# Patient Record
Sex: Male | Born: 1960 | Race: White | Hispanic: No | Marital: Married | Smoking: Never smoker
Health system: Southern US, Community
[De-identification: ages and names within clinical notes are randomized; demographics above are authoritative.]

## PROBLEM LIST (undated history)

## (undated) DIAGNOSIS — M1990 Unspecified osteoarthritis, unspecified site: Secondary | ICD-10-CM

## (undated) DIAGNOSIS — J45909 Unspecified asthma, uncomplicated: Secondary | ICD-10-CM

## (undated) DIAGNOSIS — I10 Essential (primary) hypertension: Secondary | ICD-10-CM

## (undated) HISTORY — PX: AMPUTATION AT METACARPAL: SUR22

## (undated) HISTORY — PX: KNEE ARTHROPLASTY: SHX992

---

## 2020-08-26 NOTE — H&P (Signed)
KNEE ARTHROPLASTY ADMISSION H&P  Patient ID: Robert Bernard MRN: [id number] DOB/AGE: 1960/10/05 59 y.o.  Chief Complaint: right knee pain.  Planned Procedure Date: 09/15/20 Medical Clearance by Clelia Croft     HPI: Robert Bernard is a 59 y.o. 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 The patient has a history of pain and functional disability in the right knee due to arthritis and has failed non-surgical conservative treatments for greater than 12 weeks to include NSAID's and/or analgesics, corticosteriod injections and activity modification.  Onset of symptoms was gradual, starting 3 years ago with gradually worsening course since that time. The patient noted no past surgeries on the right knee.  Patient currently rates pain at 7 out of 10 with activity. Patient has night pain, worsening of pain with activity and weight bearing, pain that interferes with activities of daily living, pain with passive range of motion and joint swelling.  Patient has evidence of periarticular osteophytes and joint space narrowing by imaging studies.  There is no active infection.  Past medical history: Asthma HTN  Past Medications  HCTZ 12.5mg  3x per week Metoprol ER 100mg  BID Meloxicam 15mg  qAM Acetaminophen 650mg  BID Vit A qAM Melatonin 3mg  qPM Vit D3 5000U qPM Vit C 1000mg  qPM Magnesium 400mg  qPM Ibuprofen PM 200mg /38mg  2 tabs qPM    Allergies: Codeine: Hallucinations   Social History   Socioeconomic History  . Marital status: Not on file    Spouse name: Not on file  . Number of children: Not on file  . Years of education: Not on file  . Highest education level: Not on file  Occupational History  . Not on file  Tobacco Use  . Smoking status: Not on file  Substance and Sexual Activity  . Alcohol use: Not on file  . Drug use: Not on file  . Sexual activity: Not on file  Other Topics Concern  . Not on file  Social History Narrative  . Not on file   Social Determinants of  Health   Financial Resource Strain:   . Difficulty of Paying Living Expenses: Not on file  Food Insecurity:   . Worried About in the Last Year: Not on file  . Ran Out of Food in the Last Year: Not on file  Transportation Needs:   . Lack of Transportation (Medical): Not on file  . Lack of Transportation (Non-Medical): Not on file  Physical Activity:   . Days of Exercise per Week: Not on file  . Minutes of Exercise per Session: Not on file  Stress:   . Feeling of Stress : Not on file  Social Connections:   . Frequency of Communication with Friends and Family: Not on file  . Frequency of Social Gatherings with Friends and Family: Not on file  . Attends Religious Services: Not on file  . Active Member of Clubs or Organizations: Not on file  . Attends Meetings: Not on file  . Marital Status: Not on file   Family History Father: Heart Disease, MI, HTN, DM II Mother: HTN, DMII, Uterine CA   ROS: Currently denies lightheadedness, dizziness, Fever, chills, CP, SOB. No personal history of DVT, PE, MI, or CVA. No loose teeth or dentures All other systems have been reviewed and were otherwise currently negative with the exception of those mentioned in the HPI and as above.  Objective: Vitals: Ht: 60 Wt: 215 lbs Temp: 97.5 BP: 137/88 Pulse: 56 O2 96% on  room air.   Physical Exam: General: Alert, NAD.  HEENT: EOMI, Trachea Midline, Head is AT/[place] Pulm: No increased work of breathing.  Clear B/L A/P w/o crackle or wheeze.  CV: RRR, No m/g/r appreciated  GI: soft, NT, ND Neuro: Neuro without gross focal deficit.  Sensation intact distally Skin: No lesions in the area of chief complaint MSK/Surgical Site: Right knee w/o redness or effusion.  Positive JLT. ROM limited 2/2 pain.  5/5 strength in extension and flexion.  +EHL/FHL.  NVI.  Stable varus and valgus stress.    Imaging Review Plain radiographs demonstrate severe degenerative joint disease  of the right knee.   The overall alignment isneutral. The bone quality appears to be good for age and reported activity level.  Preoperative templating of the joint replacement has been completed, documented, and submitted to the Operating Room personnel in order to optimize intra-operative equipment management.  Assessment: OA RIGHT KNEE Active Problems:   * No active hospital problems. *   Plan: Plan for Procedure(s): TOTAL KNEE ARTHROPLASTY  The patient history, physical exam, clinical judgement of the provider and imaging are consistent with end stage degenerative joint disease and right joint arthroplasty is deemed medically necessary. The treatment options including medical management, injection therapy, and arthroplasty were discussed at length. The risks and benefits of Procedure(s): TOTAL KNEE ARTHROPLASTY were presented and reviewed.  The risks of nonoperative treatment, versus surgical intervention including but not limited to continued pain, aseptic loosening, stiffness, dislocation/subluxation, infection, bleeding, nerve injury, blood clots, cardiopulmonary complications, morbidity, mortality, among others were discussed. The patient verbalizes understanding and wishes to proceed with the plan.  Patient is being admitted for inpatient treatment for surgery, pain control, PT, prophylactic antibiotics, VTE prophylaxis, progressive ambulation, ADL's and discharge planning.   Dental prophylaxis discussed and recommended for 2 years postoperatively.   The patient does meet the criteria for TXA which will be used perioperatively.    ASA 81 mg BID will be used postoperatively for DVT prophylaxis in addition to SCDs, and early ambulation.  Plan for Tylenol, Celebrex, oxycodone for pain.  Robaxin for spasm.  Omeprazole for gastric protection.  The patient is planning to be discharged home with OPPT in care of his wife.   Rainey Pines, PA-C 08/26/2020 1:08 PM

## 2020-09-04 NOTE — Patient Instructions (Addendum)
DUE TO COVID-19 ONLY ONE VISITOR IS ALLOWED TO COME WITH YOU AND STAY IN THE WAITING ROOM ONLY DURING PRE OP AND PROCEDURE DAY OF SURGERY. THE 1 VISITOR  MAY VISIT WITH YOU AFTER SURGERY IN YOUR PRIVATE ROOM DURING VISITING HOURS ONLY!  YOU NEED TO HAVE A COVID 19 TEST ON: 09/12/20 @ 11:00 AM, THIS TEST MUST BE DONE BEFORE SURGERY,  COVID TESTING SITE [address] [place] [address], IT IS ON THE RIGHT GOING OUT [address] APPROXIMATELY  2 MINUTES PAST [employer] ON THE RIGHT. ONCE YOUR COVID TEST IS COMPLETED,  PLEASE BEGIN THE QUARANTINE INSTRUCTIONS AS OUTLINED IN YOUR HANDOUT.                Robert Bernard     Your procedure is scheduled on: 09/15/20   Report to [facility] Main  Entrance   Report to short stay at: 5:30 AM     Call this number if you have problems the morning of surgery [phone]    Remember:   NO SOLID FOOD AFTER MIDNIGHT THE NIGHT PRIOR TO SURGERY. NOTHING BY MOUTH EXCEPT CLEAR LIQUIDS UNTIL: 4:30 AM . PLEASE FINISH ENSURE DRINK PER SURGEON ORDER  WHICH NEEDS TO BE COMPLETED AT: 4:30 AM .  CLEAR LIQUID DIET   Foods Allowed                                                                     Foods Excluded  Coffee and tea, regular and decaf                             liquids that you cannot  Plain Jell-O any favor except red or purple                                           see through such as: Fruit ices (not with fruit pulp)                                     milk, soups, orange juice  Iced Popsicles                                    All solid food Carbonated beverages, regular and diet                                    Cranberry, grape and apple juices Sports drinks like Gatorade Lightly seasoned clear broth or consume(fat free) Sugar, honey syrup  Sample Menu Breakfast                                Lunch  Supper Cranberry juice                    Beef broth                             Chicken broth Jell-O                                     Grape juice                           Apple juice Coffee or tea                        Jell-O                                      Popsicle                                                Coffee or tea                        Coffee or tea  _____________________________________________________________________   BRUSH YOUR TEETH MORNING OF SURGERY AND RINSE YOUR MOUTH OUT, NO CHEWING GUM CANDY OR MINTS.   Take Metoprolol with a sip of water.                              You may not have any metal on your body including hair pins and              piercings  Do not wear jewelry, lotions, powders or perfumes, deodorant             Men may shave face and neck.   Do not bring valuables to the hospital. [facility] IS NOT             RESPONSIBLE   FOR VALUABLES.  Contacts, dentures or bridgework may not be worn into surgery.  Leave suitcase in the car. After surgery it may be brought to your room.     Patients discharged the day of surgery will not be allowed to drive home. IF YOU ARE HAVING SURGERY AND GOING HOME THE SAME DAY, YOU MUST HAVE AN ADULT TO DRIVE YOU HOME AND BE WITH YOU FOR 24 HOURS. YOU MAY GO HOME BY TAXI OR UBER OR ORTHERWISE, BUT AN ADULT MUST ACCOMPANY YOU HOME AND STAY WITH YOU FOR 24 HOURS.  Name and phone number of your driver:  Special Instructions: N/A              Please read over the following fact sheets you were given: _____________________________________________________________________        [facility] - Preparing for Surgery Before surgery, you can play an important role.  Because skin is not sterile, your skin needs to be as free of germs as possible.  You can reduce the number of germs on your skin by washing with CHG (chlorahexidine gluconate) soap before surgery.  CHG is an antiseptic cleaner  which kills germs and bonds with the skin to continue killing germs even after washing. Please DO NOT use  if you have an allergy to CHG or antibacterial soaps.  If your skin becomes reddened/irritated stop using the CHG and inform your nurse when you arrive at Short Stay. Do not shave (including legs and underarms) for at least 48 hours prior to the first CHG shower.  You may shave your face/neck. Please follow these instructions carefully:  1.  Shower with CHG Soap the night before surgery and the  morning of Surgery.  2.  If you choose to wash your hair, wash your hair first as usual with your  normal  shampoo.  3.  After you shampoo, rinse your hair and body thoroughly to remove the  shampoo.                           4.  Use CHG as you would any other liquid soap.  You can apply chg directly  to the skin and wash                       Gently with a scrungie or clean washcloth.  5.  Apply the CHG Soap to your body ONLY FROM THE NECK DOWN.   Do not use on face/ open                           Wound or open sores. Avoid contact with eyes, ears mouth and genitals (private parts).                       Wash face,  Genitals (private parts) with your normal soap.             6.  Wash thoroughly, paying special attention to the area where your surgery  will be performed.  7.  Thoroughly rinse your body with warm water from the neck down.  8.  DO NOT shower/wash with your normal soap after using and rinsing off  the CHG Soap.                9.  Pat yourself dry with a clean towel.            10.  Wear clean pajamas.            11.  Place clean sheets on your bed the night of your first shower and do not  sleep with pets. Day of Surgery : Do not apply any lotions/deodorants the morning of surgery.  Please wear clean clothes to the hospital/surgery center.  FAILURE TO FOLLOW THESE INSTRUCTIONS MAY RESULT IN THE CANCELLATION OF YOUR SURGERY PATIENT SIGNATURE_________________________________  NURSE  SIGNATURE__________________________________  ________________________________________________________________________

## 2020-09-07 ENCOUNTER — Encounter (HOSPITAL_COMMUNITY): Payer: Self-pay

## 2020-09-07 ENCOUNTER — Encounter (INDEPENDENT_AMBULATORY_CARE_PROVIDER_SITE_OTHER): Payer: Self-pay

## 2020-09-07 ENCOUNTER — Other Ambulatory Visit: Payer: Self-pay

## 2020-09-07 ENCOUNTER — Encounter (HOSPITAL_COMMUNITY)
Admission: RE | Admit: 2020-09-07 | Discharge: 2020-09-07 | Disposition: A | Discharge status: Home / Self Care | Payer: 59 | Source: Ambulatory Visit | Attending: Orthopedic Surgery | Admitting: Orthopedic Surgery

## 2020-09-07 DIAGNOSIS — Z01818 Encounter for other preprocedural examination: Secondary | ICD-10-CM | POA: Insufficient documentation

## 2020-09-07 HISTORY — DX: Essential (primary) hypertension: I10

## 2020-09-07 HISTORY — DX: Unspecified asthma, uncomplicated: J45.909

## 2020-09-07 HISTORY — DX: Unspecified osteoarthritis, unspecified site: M19.90

## 2020-09-07 LAB — BASIC METABOLIC PANEL
Anion gap: 8 (ref 5–15)
BUN: 24 mg/dL — ABNORMAL HIGH (ref 6–20)
CO2: 25 mmol/L (ref 22–32)
Calcium: 9.3 mg/dL (ref 8.9–10.3)
Chloride: 106 mmol/L (ref 98–111)
Creatinine, Ser: 0.71 mg/dL (ref 0.61–1.24)
GFR, Estimated: >60 mL/min (ref >60)
Glucose, Bld: 114 mg/dL — ABNORMAL HIGH (ref 70–99)
Potassium: 4.6 mmol/L (ref 3.5–5.1)
Sodium: 139 mmol/L (ref 135–145)

## 2020-09-07 LAB — CBC
HCT: 42.3 % (ref 39.0–52.0)
Hemoglobin: 14.1 g/dL (ref 13.0–17.0)
MCH: 32 pg (ref 26.0–34.0)
MCHC: 33.3 g/dL (ref 30.0–36.0)
MCV: 96.1 fL (ref 80.0–100.0)
Platelets: 172 10*3/uL (ref 150–400)
RBC: 4.4 MIL/uL (ref 4.22–5.81)
RDW: 12.9 % (ref 11.5–15.5)
WBC: 5.8 10*3/uL (ref 4.0–10.5)
nRBC: 0 % (ref 0.0–0.2)

## 2020-09-07 LAB — SURGICAL PCR SCREEN
MRSA, PCR: NEGATIVE
Staphylococcus aureus: NEGATIVE

## 2020-09-07 NOTE — Progress Notes (Signed)
COVID Vaccine Completed: NO Date COVID Vaccine completed: COVID vaccine manufacturer: Pfizer    Moderna   [employer]   PCP - Lucas Mallow: PA Cardiologist -   Chest x-ray -  EKG - Requested Stress Test -  ECHO -  Cardiac Cath -  Pacemaker/ICD device last checked:  Sleep Study -  CPAP -   Fasting Blood Sugar -  Checks Blood Sugar _____ times a day  Blood Thinner Instructions: Aspirin Instructions: Last Dose:  Anesthesia review: Hx: HTN  Patient denies shortness of breath, fever, cough and chest pain at PAT appointment   Patient verbalized understanding of instructions that were given to them at the PAT appointment. Patient was also instructed that they will need to review over the PAT instructions again at home before surgery.

## 2020-09-12 ENCOUNTER — Other Ambulatory Visit (HOSPITAL_COMMUNITY)
Admission: RE | Admit: 2020-09-12 | Discharge: 2020-09-12 | Disposition: A | Discharge status: Home / Self Care | Payer: 59 | Source: Ambulatory Visit | Attending: Orthopedic Surgery | Admitting: Orthopedic Surgery

## 2020-09-12 DIAGNOSIS — Z01812 Encounter for preprocedural laboratory examination: Secondary | ICD-10-CM | POA: Diagnosis present

## 2020-09-12 DIAGNOSIS — Z20822 Contact with and (suspected) exposure to covid-19: Secondary | ICD-10-CM | POA: Insufficient documentation

## 2020-09-12 LAB — SARS CORONAVIRUS 2 (TAT 6-24 HRS): SARS Coronavirus 2: NEGATIVE

## 2020-09-14 NOTE — Anesthesia Preprocedure Evaluation (Addendum)
Anesthesia Evaluation  Patient identified by MRN, date of birth, ID band Patient awake    Reviewed: Allergy & Precautions, NPO status , Patient's Chart, lab work & pertinent test results  Airway Mallampati: I       Dental no notable dental hx.    Pulmonary asthma ,    Pulmonary exam normal        Cardiovascular hypertension, Pt. on home beta blockers and Pt. on medications Normal cardiovascular exam     Neuro/Psych negative neurological ROS  negative psych ROS   GI/Hepatic negative GI ROS, Neg liver ROS,   Endo/Other  negative endocrine ROS  Renal/GU negative Renal ROS  negative genitourinary   Musculoskeletal   Abdominal Normal abdominal exam  (+) - obese,   Peds  Hematology negative hematology ROS (+)   Anesthesia Other Findings   Reproductive/Obstetrics                            Anesthesia Physical Anesthesia Plan  ASA: II  Anesthesia Plan: Spinal   Post-op Pain Management:  [facility]ional for Post-op pain   Induction:   PONV Risk Score and Plan: 1 and Ondansetron, Dexamethasone and Propofol infusion  Airway Management Planned: Natural Airway and Simple Face Mask  Additional Equipment: None  Intra-op Plan:   Post-operative Plan:   Informed Consent: I have reviewed the patients History and Physical, chart, labs and discussed the procedure including the risks, benefits and alternatives for the proposed anesthesia with the patient or authorized representative who has indicated his/her understanding and acceptance.       Plan Discussed with: CRNA  Anesthesia Plan Comments:        Anesthesia Quick Evaluation

## 2020-09-15 ENCOUNTER — Ambulatory Visit (HOSPITAL_COMMUNITY): Payer: 59

## 2020-09-15 ENCOUNTER — Ambulatory Visit (HOSPITAL_COMMUNITY): Payer: 59 | Admitting: Certified Registered"

## 2020-09-15 ENCOUNTER — Encounter (HOSPITAL_COMMUNITY)
Admission: RE | Disposition: A | Discharge status: Home / Self Care | Payer: Self-pay | Source: Home / Self Care | Attending: Orthopedic Surgery

## 2020-09-15 ENCOUNTER — Ambulatory Visit (HOSPITAL_COMMUNITY)
Admission: RE | Admit: 2020-09-15 | Discharge: 2020-09-15 | Disposition: A | Discharge status: Home / Self Care | Payer: 59 | Attending: Orthopedic Surgery | Admitting: Orthopedic Surgery

## 2020-09-15 ENCOUNTER — Other Ambulatory Visit: Payer: Self-pay

## 2020-09-15 ENCOUNTER — Encounter (HOSPITAL_COMMUNITY): Payer: Self-pay | Admitting: Orthopedic Surgery

## 2020-09-15 DIAGNOSIS — Z96651 Presence of right artificial knee joint: Secondary | ICD-10-CM

## 2020-09-15 DIAGNOSIS — M25761 Osteophyte, right knee: Secondary | ICD-10-CM | POA: Diagnosis not present

## 2020-09-15 DIAGNOSIS — M1711 Unilateral primary osteoarthritis, right knee: Secondary | ICD-10-CM | POA: Insufficient documentation

## 2020-09-15 DIAGNOSIS — M25461 Effusion, right knee: Secondary | ICD-10-CM | POA: Diagnosis not present

## 2020-09-15 DIAGNOSIS — Z885 Allergy status to narcotic agent status: Secondary | ICD-10-CM | POA: Insufficient documentation

## 2020-09-15 HISTORY — PX: TOTAL KNEE ARTHROPLASTY: SHX125

## 2020-09-15 SURGERY — ARTHROPLASTY, KNEE, TOTAL
Anesthesia: Spinal | Site: Knee | Laterality: Right

## 2020-09-15 MED ORDER — STERILE WATER FOR IRRIGATION IR SOLN
Status: DC | PRN
  Administered 2020-09-15: 2000 mL

## 2020-09-15 MED ORDER — OXYCODONE HCL 5 MG PO TABS: 5.0000 mg | ORAL_TABLET | Freq: Four times a day (QID) | ORAL | 0 refills | Status: AC | PRN

## 2020-09-15 MED ORDER — DEXAMETHASONE SODIUM PHOSPHATE 10 MG/ML IJ SOLN
INTRAMUSCULAR | Status: DC | PRN
  Administered 2020-09-15: 8 mg via INTRAVENOUS

## 2020-09-15 MED ORDER — PROPOFOL 500 MG/50ML IV EMUL
INTRAVENOUS | Status: AC
  Filled 2020-09-15: qty 50

## 2020-09-15 MED ORDER — ORAL CARE MOUTH RINSE: 15.0000 mL | Freq: Once | OROMUCOSAL | Status: AC

## 2020-09-15 MED ORDER — PROPOFOL 500 MG/50ML IV EMUL
INTRAVENOUS | Status: DC | PRN
  Administered 2020-09-15: 75 ug/kg/min via INTRAVENOUS

## 2020-09-15 MED ORDER — ACETAMINOPHEN 160 MG/5ML PO SOLN: 325.0000 mg | ORAL | Status: DC | PRN

## 2020-09-15 MED ORDER — MEPIVACAINE HCL (PF) 2 % IJ SOLN
INTRAMUSCULAR | Status: DC | PRN
  Administered 2020-09-15: 3.5 mL via EPIDURAL

## 2020-09-15 MED ORDER — TRANEXAMIC ACID-NACL 1000-0.7 MG/100ML-% IV SOLN: 1000.0000 mg | INTRAVENOUS | Status: DC

## 2020-09-15 MED ORDER — MEPERIDINE HCL 50 MG/ML IJ SOLN: 6.2500 mg | INTRAMUSCULAR | Status: DC | PRN

## 2020-09-15 MED ORDER — PROPOFOL 10 MG/ML IV BOLUS
INTRAVENOUS | Status: AC
  Filled 2020-09-15: qty 40

## 2020-09-15 MED ORDER — LIDOCAINE 2% (20 MG/ML) 5 ML SYRINGE
INTRAMUSCULAR | Status: DC | PRN
  Administered 2020-09-15: 40 mg via INTRAVENOUS

## 2020-09-15 MED ORDER — ONDANSETRON HCL 4 MG/2ML IJ SOLN
INTRAMUSCULAR | Status: AC
  Filled 2020-09-15: qty 2

## 2020-09-15 MED ORDER — ACETAMINOPHEN 325 MG PO TABS: 325.0000 mg | ORAL_TABLET | Freq: Four times a day (QID) | ORAL | Status: DC | PRN

## 2020-09-15 MED ORDER — 0.9 % SODIUM CHLORIDE (POUR BTL) OPTIME
TOPICAL | Status: DC | PRN
  Administered 2020-09-15: 08:00:00 1000 mL

## 2020-09-15 MED ORDER — HYDROMORPHONE HCL 1 MG/ML IJ SOLN: 0.5000 mg | INTRAMUSCULAR | Status: DC | PRN

## 2020-09-15 MED ORDER — ONDANSETRON HCL 4 MG PO TABS: 4.0000 mg | ORAL_TABLET | Freq: Four times a day (QID) | ORAL | Status: DC | PRN

## 2020-09-15 MED ORDER — LACTATED RINGERS IV SOLN: INTRAVENOUS | Status: DC

## 2020-09-15 MED ORDER — ACETAMINOPHEN 500 MG PO TABS: 1000.0000 mg | ORAL_TABLET | Freq: Four times a day (QID) | ORAL | 2 refills | Status: AC | PRN

## 2020-09-15 MED ORDER — SODIUM CHLORIDE (PF) 0.9 % IJ SOLN
INTRAMUSCULAR | Status: AC
  Filled 2020-09-15: qty 30

## 2020-09-15 MED ORDER — CEFAZOLIN SODIUM-DEXTROSE 2-4 GM/100ML-% IV SOLN: 2.0000 g | Freq: Four times a day (QID) | INTRAVENOUS | Status: DC

## 2020-09-15 MED ORDER — SODIUM CHLORIDE 0.9 % IR SOLN
Status: DC | PRN
  Administered 2020-09-15: 1000 mL

## 2020-09-15 MED ORDER — PHENOL 1.4 % MT LIQD: 1.0000 | OROMUCOSAL | Status: DC | PRN

## 2020-09-15 MED ORDER — ONDANSETRON HCL 4 MG PO TABS: 4.0000 mg | ORAL_TABLET | Freq: Three times a day (TID) | ORAL | 0 refills | Status: AC | PRN

## 2020-09-15 MED ORDER — DOCUSATE SODIUM 100 MG PO CAPS
100.0000 mg | ORAL_CAPSULE | Freq: Two times a day (BID) | ORAL | Status: DC
Start: 2020-09-15 — End: 2020-09-15

## 2020-09-15 MED ORDER — ROPIVACAINE HCL 7.5 MG/ML IJ SOLN
INTRAMUSCULAR | Status: DC | PRN
  Administered 2020-09-15 (×4): 5 mL via PERINEURAL

## 2020-09-15 MED ORDER — CLONIDINE HCL (ANALGESIA) 100 MCG/ML EP SOLN
EPIDURAL | Status: DC | PRN
  Administered 2020-09-15: 100 ug

## 2020-09-15 MED ORDER — SODIUM CHLORIDE 0.9% FLUSH
INTRAVENOUS | Status: DC | PRN
  Administered 2020-09-15: 30 mL

## 2020-09-15 MED ORDER — BISACODYL 10 MG RE SUPP
10.0000 mg | Freq: Every day | RECTAL | Status: DC | PRN
Start: 2020-09-15 — End: 2020-09-15

## 2020-09-15 MED ORDER — DIPHENHYDRAMINE HCL 12.5 MG/5ML PO ELIX: 12.5000 mg | ORAL_SOLUTION | ORAL | Status: DC | PRN

## 2020-09-15 MED ORDER — METHOCARBAMOL 500 MG IVPB - SIMPLE MED: 500.0000 mg | Freq: Four times a day (QID) | INTRAVENOUS | Status: DC | PRN

## 2020-09-15 MED ORDER — EPHEDRINE SULFATE-NACL 50-0.9 MG/10ML-% IV SOSY
PREFILLED_SYRINGE | INTRAVENOUS | Status: DC | PRN
  Administered 2020-09-15 (×3): 10 mg via INTRAVENOUS

## 2020-09-15 MED ORDER — PROPOFOL 1000 MG/100ML IV EMUL
INTRAVENOUS | Status: AC
  Filled 2020-09-15: qty 100

## 2020-09-15 MED ORDER — CELECOXIB 200 MG PO CAPS: 200.0000 mg | ORAL_CAPSULE | Freq: Two times a day (BID) | ORAL | 0 refills | Status: AC

## 2020-09-15 MED ORDER — OXYCODONE HCL 5 MG PO TABS
ORAL_TABLET | ORAL | Status: AC
  Filled 2020-09-15: qty 2

## 2020-09-15 MED ORDER — MENTHOL 3 MG MT LOZG: 1.0000 | LOZENGE | OROMUCOSAL | Status: DC | PRN

## 2020-09-15 MED ORDER — METOCLOPRAMIDE HCL 5 MG PO TABS: 5.0000 mg | ORAL_TABLET | Freq: Three times a day (TID) | ORAL | Status: DC | PRN

## 2020-09-15 MED ORDER — ROPIVACAINE HCL 5 MG/ML IJ SOLN
INTRAMUSCULAR | Status: DC | PRN
  Administered 2020-09-15 (×2): 5 mL via PERINEURAL

## 2020-09-15 MED ORDER — ONDANSETRON HCL 4 MG/2ML IJ SOLN: 4.0000 mg | Freq: Four times a day (QID) | INTRAMUSCULAR | Status: DC | PRN

## 2020-09-15 MED ORDER — LACTATED RINGERS IV BOLUS
250.0000 mL | Freq: Once | INTRAVENOUS | Status: AC
  Administered 2020-09-15: 12:00:00 250 mL via INTRAVENOUS

## 2020-09-15 MED ORDER — CELECOXIB 200 MG PO CAPS: 200.0000 mg | ORAL_CAPSULE | Freq: Two times a day (BID) | ORAL | Status: DC

## 2020-09-15 MED ORDER — FENTANYL CITRATE (PF) 100 MCG/2ML IJ SOLN
INTRAMUSCULAR | Status: AC
  Filled 2020-09-15: qty 2

## 2020-09-15 MED ORDER — PROPOFOL 10 MG/ML IV BOLUS
INTRAVENOUS | Status: DC | PRN
  Administered 2020-09-15: 30 mg via INTRAVENOUS

## 2020-09-15 MED ORDER — SENNOSIDES-DOCUSATE SODIUM 8.6-50 MG PO TABS: 1.0000 | ORAL_TABLET | Freq: Every evening | ORAL | Status: DC | PRN

## 2020-09-15 MED ORDER — CHLORHEXIDINE GLUCONATE 0.12 % MT SOLN
15.0000 mL | Freq: Once | OROMUCOSAL | Status: AC
  Administered 2020-09-15: 06:00:00 15 mL via OROMUCOSAL

## 2020-09-15 MED ORDER — FENTANYL CITRATE (PF) 100 MCG/2ML IJ SOLN
25.0000 ug | INTRAMUSCULAR | Status: DC | PRN
Start: 2020-09-15 — End: 2020-09-15

## 2020-09-15 MED ORDER — TRANEXAMIC ACID-NACL 1000-0.7 MG/100ML-% IV SOLN
1000.0000 mg | INTRAVENOUS | Status: AC
  Administered 2020-09-15: 08:00:00 1000 mg via INTRAVENOUS
  Filled 2020-09-15: qty 100

## 2020-09-15 MED ORDER — MIDAZOLAM HCL 2 MG/2ML IJ SOLN
INTRAMUSCULAR | Status: AC
  Filled 2020-09-15: qty 2

## 2020-09-15 MED ORDER — EPHEDRINE 5 MG/ML INJ
INTRAVENOUS | Status: AC
  Filled 2020-09-15: qty 10

## 2020-09-15 MED ORDER — FENTANYL CITRATE (PF) 100 MCG/2ML IJ SOLN
INTRAMUSCULAR | Status: DC | PRN
  Administered 2020-09-15 (×2): 50 ug via INTRAVENOUS

## 2020-09-15 MED ORDER — ONDANSETRON HCL 4 MG/2ML IJ SOLN
INTRAMUSCULAR | Status: DC | PRN
  Administered 2020-09-15: 4 mg via INTRAVENOUS

## 2020-09-15 MED ORDER — BUPIVACAINE LIPOSOME 1.3 % IJ SUSP
20.0000 mL | Freq: Once | INTRAMUSCULAR | Status: AC
  Administered 2020-09-15: 09:00:00 20 mL
  Filled 2020-09-15: qty 20

## 2020-09-15 MED ORDER — CEFAZOLIN SODIUM-DEXTROSE 2-4 GM/100ML-% IV SOLN
2.0000 g | INTRAVENOUS | Status: AC
  Administered 2020-09-15: 08:00:00 2 g via INTRAVENOUS
  Filled 2020-09-15: qty 100

## 2020-09-15 MED ORDER — MIDAZOLAM HCL 2 MG/2ML IJ SOLN
INTRAMUSCULAR | Status: DC | PRN
  Administered 2020-09-15: 2 mg via INTRAVENOUS

## 2020-09-15 MED ORDER — METOCLOPRAMIDE HCL 5 MG/ML IJ SOLN: 5.0000 mg | Freq: Three times a day (TID) | INTRAMUSCULAR | Status: DC | PRN

## 2020-09-15 MED ORDER — OXYCODONE HCL 5 MG PO TABS
10.0000 mg | ORAL_TABLET | ORAL | Status: DC | PRN
  Administered 2020-09-15: 10 mg via ORAL

## 2020-09-15 MED ORDER — ONDANSETRON HCL 4 MG/2ML IJ SOLN: 4.0000 mg | Freq: Once | INTRAMUSCULAR | Status: DC | PRN

## 2020-09-15 MED ORDER — DEXAMETHASONE SODIUM PHOSPHATE 10 MG/ML IJ SOLN
INTRAMUSCULAR | Status: DC | PRN
  Administered 2020-09-15: 07:00:00 10 mg

## 2020-09-15 MED ORDER — LACTATED RINGERS IV BOLUS
500.0000 mL | Freq: Once | INTRAVENOUS | Status: AC
  Administered 2020-09-15: 10:00:00 500 mL via INTRAVENOUS

## 2020-09-15 MED ORDER — METHOCARBAMOL 500 MG PO TABS: 500.0000 mg | ORAL_TABLET | Freq: Four times a day (QID) | ORAL | 0 refills | Status: AC

## 2020-09-15 MED ORDER — METHOCARBAMOL 500 MG PO TABS
500.0000 mg | ORAL_TABLET | Freq: Four times a day (QID) | ORAL | Status: DC | PRN
  Administered 2020-09-15: 12:00:00 500 mg via ORAL

## 2020-09-15 MED ORDER — ASPIRIN 81 MG PO CHEW: 81.0000 mg | CHEWABLE_TABLET | Freq: Two times a day (BID) | ORAL | Status: DC

## 2020-09-15 MED ORDER — ACETAMINOPHEN 500 MG PO TABS
1000.0000 mg | ORAL_TABLET | Freq: Once | ORAL | Status: AC
Start: 2020-09-15 — End: 2020-09-15
  Administered 2020-09-15: 06:00:00 1000 mg via ORAL
  Filled 2020-09-15: qty 2

## 2020-09-15 MED ORDER — ACETAMINOPHEN 325 MG PO TABS
325.0000 mg | ORAL_TABLET | ORAL | Status: DC | PRN
Start: 2020-09-15 — End: 2020-09-15

## 2020-09-15 MED ORDER — ASPIRIN EC 81 MG PO TBEC: 81.0000 mg | DELAYED_RELEASE_TABLET | Freq: Every day | ORAL | 0 refills | Status: AC

## 2020-09-15 MED ORDER — OXYCODONE HCL 5 MG PO TABS: 5.0000 mg | ORAL_TABLET | ORAL | Status: DC | PRN

## 2020-09-15 MED ORDER — METHOCARBAMOL 500 MG PO TABS
ORAL_TABLET | ORAL | Status: AC
  Filled 2020-09-15: qty 1

## 2020-09-15 MED ORDER — KETOROLAC TROMETHAMINE 30 MG/ML IJ SOLN: 30.0000 mg | Freq: Once | INTRAMUSCULAR | Status: DC | PRN

## 2020-09-15 MED ORDER — DEXAMETHASONE SODIUM PHOSPHATE 10 MG/ML IJ SOLN
INTRAMUSCULAR | Status: AC
  Filled 2020-09-15: qty 1

## 2020-09-15 MED ORDER — OMEPRAZOLE 20 MG PO CPDR: 20.0000 mg | DELAYED_RELEASE_CAPSULE | Freq: Every day | ORAL | 0 refills | Status: AC

## 2020-09-15 MED ORDER — DIPHENHYDRAMINE HCL 50 MG PO TABS: 25.0000 mg | ORAL_TABLET | ORAL | 0 refills | Status: AC | PRN

## 2020-09-15 SURGICAL SUPPLY — 56 items
BLADE HEX COATED 2.75 (ELECTRODE) ×3 IMPLANT
BLADE SAG 18X100X1.27 (BLADE) ×3 IMPLANT
BLADE SAGITTAL 25.0X1.37X90 (BLADE) ×2 IMPLANT
BLADE SAGITTAL 25.0X1.37X90MM (BLADE) ×1
BLADE SURG 15 STRL LF DISP TIS (BLADE) ×1 IMPLANT
BLADE SURG 15 STRL SS (BLADE) ×2
BLADE SURG SZ10 CARB STEEL (BLADE) ×6 IMPLANT
BNDG ELASTIC 6X10 VLCR STRL LF (GAUZE/BANDAGES/DRESSINGS) ×3 IMPLANT
BOWL SMART MIX CTS (DISPOSABLE) ×3 IMPLANT
CEMENT BONE SIMPLEX SPEEDSET (Cement) IMPLANT
CLOSURE STERI-STRIP 1/2X4 (GAUZE/BANDAGES/DRESSINGS) ×1
CLOSURE WOUND 1/2 X4 (GAUZE/BANDAGES/DRESSINGS) ×3
CLSR STERI-STRIP ANTIMIC 1/2X4 (GAUZE/BANDAGES/DRESSINGS) ×2 IMPLANT
COVER SURGICAL LIGHT HANDLE (MISCELLANEOUS) ×3 IMPLANT
COVER WAND RF STERILE (DRAPES) IMPLANT
CUFF TOURN SGL QUICK 34 (TOURNIQUET CUFF) ×2
CUFF TRNQT CYL 34X4.125X (TOURNIQUET CUFF) ×1 IMPLANT
DECANTER SPIKE VIAL GLASS SM (MISCELLANEOUS) ×3 IMPLANT
DRAPE IMP U-DRAPE 54X76 (DRAPES) ×3 IMPLANT
DRAPE U-SHAPE 47X51 STRL (DRAPES) ×3 IMPLANT
DRSG MEPILEX BORDER 4X12 (GAUZE/BANDAGES/DRESSINGS) ×3 IMPLANT
DURAPREP 26ML APPLICATOR (WOUND CARE) ×6 IMPLANT
FEMORAL POSTERIOR SZ5 RT (Femur) ×1 IMPLANT
GLOVE BIO SURGEON STRL SZ7.5 (GLOVE) ×6 IMPLANT
GLOVE BIOGEL PI IND STRL 7.5 (GLOVE) ×1 IMPLANT
GLOVE BIOGEL PI IND STRL 8 (GLOVE) ×1 IMPLANT
GLOVE BIOGEL PI INDICATOR 7.5 (GLOVE) ×2
GLOVE BIOGEL PI INDICATOR 8 (GLOVE) ×2
GOWN STRL REUS W/ TWL LRG LVL3 (GOWN DISPOSABLE) ×2 IMPLANT
GOWN STRL REUS W/TWL LRG LVL3 (GOWN DISPOSABLE) ×4
HANDPIECE INTERPULSE COAX TIP (DISPOSABLE) ×2
HOLDER FOLEY CATH W/STRAP (MISCELLANEOUS) ×3 IMPLANT
IMMOBILIZER KNEE 20 (SOFTGOODS) ×3
IMMOBILIZER KNEE 20 THIGH 36 (SOFTGOODS) ×1 IMPLANT
IMMOBILIZER KNEE 22 UNIV (SOFTGOODS) ×3 IMPLANT
INSERT TIB BEARING PS 5 9 (Miscellaneous) ×3 IMPLANT
KIT TURNOVER KIT A (KITS) IMPLANT
KNEE PATELLA ASYMMETRIC 9X29 (Knees) ×3 IMPLANT
KNEE TIBIAL COMPONENT SZ5 (Knees) ×3 IMPLANT
MANIFOLD NEPTUNE II (INSTRUMENTS) ×3 IMPLANT
NS IRRIG 1000ML POUR BTL (IV SOLUTION) ×3 IMPLANT
PACK ICE MAXI GEL EZY WRAP (MISCELLANEOUS) ×3 IMPLANT
PACK TOTAL KNEE CUSTOM (KITS) ×3 IMPLANT
PENCIL SMOKE EVACUATOR (MISCELLANEOUS) IMPLANT
POSTERIOR FEMORAL SZ5 RT (Femur) ×3 IMPLANT
PROTECTOR NERVE ULNAR (MISCELLANEOUS) ×6 IMPLANT
SET HNDPC FAN SPRY TIP SCT (DISPOSABLE) ×1 IMPLANT
STRIP CLOSURE SKIN 1/2X4 (GAUZE/BANDAGES/DRESSINGS) ×6 IMPLANT
SUT MNCRL AB 3-0 PS2 18 (SUTURE) ×3 IMPLANT
SUT VIC AB 0 CT1 36 (SUTURE) ×3 IMPLANT
SUT VIC AB 1 CT1 36 (SUTURE) ×6 IMPLANT
SUT VIC AB 2-0 CT1 27 (SUTURE) ×2
SUT VIC AB 2-0 CT1 TAPERPNT 27 (SUTURE) ×1 IMPLANT
TRAY FOLEY MTR SLVR 16FR STAT (SET/KITS/TRAYS/PACK) ×3 IMPLANT
TUBE SUCTION HIGH CAP CLEAR NV (SUCTIONS) ×3 IMPLANT
WRAP KNEE MAXI GEL POST OP (GAUZE/BANDAGES/DRESSINGS) ×3 IMPLANT

## 2020-09-15 NOTE — Anesthesia Procedure Notes (Signed)
Spinal  Patient location during procedure: OR Start time: 09/15/2020 7:33 AM Staffing Performed: resident/CRNA  Anesthesiologist: Lyn Hollingshead, MD Resident/CRNA: Eben Burow, CRNA Preanesthetic Checklist Completed: patient identified, IV checked, site marked, risks and benefits discussed, surgical consent, monitors and equipment checked, pre-op evaluation and timeout performed Spinal Block Patient position: sitting Prep: DuraPrep and site prepped and draped Patient monitoring: continuous pulse ox, blood pressure, heart rate and cardiac monitor Approach: midline Location: L3-4 Injection technique: single-shot Needle Needle type: Pencan  Needle gauge: 24 G Needle length: 9 cm Assessment Sensory level: T4 Additional Notes Pt placed in sitting position, spinal kit expiration date checked and verified, + CSF, - heme, pt tolerated well. Dr Jillyn Hidden present and supervising throughout.

## 2020-09-15 NOTE — Discharge Instructions (Signed)
You may bear weight as tolerated. Keep your dressing on and dry until follow up. Take medicine to prevent blood clots as directed. Take pain medicine as needed with the goal of transitioning to over the counter medicines.  If needed, you may increase breakthrough pain medication (oxycodone) for the first few days post op - up to 2 tablets every 4 hours.  Stop this medication as soon as you are able.  INSTRUCTIONS AFTER JOINT REPLACEMENT   o Remove items at home which could result in a fall. This includes throw rugs or furniture in walking pathways o ICE to the affected joint every three hours while awake for 30 minutes at a time, for at least the first 3-5 days, and then as needed for pain and swelling.  Continue to use ice for pain and swelling. You may notice swelling that will progress down to the foot and ankle.  This is normal after surgery.  Elevate your leg when you are not up walking on it.   o Continue to use the breathing machine you got in the hospital (incentive spirometer) which will help keep your temperature down.  It is common for your temperature to cycle up and down following surgery, especially at night when you are not up moving around and exerting yourself.  The breathing machine keeps your lungs expanded and your temperature down.   DIET:  As you were doing prior to hospitalization, we recommend a well-balanced diet.  DRESSING / WOUND CARE / SHOWERING  You may shower 3 days after surgery, but keep the wounds dry during showering.  You may use an occlusive plastic wrap (Press'n Seal for example) with blue painter's tape at edges, NO SOAKING/SUBMERGING IN THE BATHTUB.  If the bandage gets wet, change with a clean dry gauze.  If the incision gets wet, pat the wound dry with a clean towel.  ACTIVITY  o Increase activity slowly as tolerated, but follow the weight bearing instructions below.   o No driving for 6 weeks or until further direction given by your physician.  You  cannot drive while taking narcotics.  o No lifting or carrying greater than 10 lbs. until further directed by your surgeon. o Avoid periods of inactivity such as sitting longer than an hour when not asleep. This helps prevent blood clots.  o You may return to work once you are authorized by your doctor.     WEIGHT BEARING   Weight bearing as tolerated with assist device (walker, cane, etc) as directed, use it as long as suggested by your surgeon or therapist, typically at least 4-6 weeks.   EXERCISES  Results after joint replacement surgery are often greatly improved when you follow the exercise, range of motion and muscle strengthening exercises prescribed by your doctor. Safety measures are also important to protect the joint from further injury. Any time any of these exercises cause you to have increased pain or swelling, decrease what you are doing until you are comfortable again and then slowly increase them. If you have problems or questions, call your caregiver or physical therapist for advice.   Rehabilitation is important following a joint replacement. After just a few days of immobilization, the muscles of the leg can become weakened and shrink (atrophy).  These exercises are designed to build up the tone and strength of the thigh and leg muscles and to improve motion. Often times heat used for twenty to thirty minutes before working out will loosen up your tissues and help with   improving the range of motion but do not use heat for the first two weeks following surgery (sometimes heat can increase post-operative swelling).   These exercises can be done on a training (exercise) mat, on the floor, on a table or on a bed. Use whatever works the best and is most comfortable for you.    Use music or television while you are exercising so that the exercises are a pleasant break in your day. This will make your life better with the exercises acting as a break in your routine that you can look  forward to.   Perform all exercises about fifteen times, three times per day or as directed.  You should exercise both the operative leg and the other leg as well.  Exercises include:   . Quad Sets - Tighten up the muscle on the front of the thigh (Quad) and hold for 5-10 seconds.   . Straight Leg Raises - With your knee straight (if you were given a brace, keep it on), lift the leg to 60 degrees, hold for 3 seconds, and slowly lower the leg.  Perform this exercise against resistance later as your leg gets stronger.  . Leg Slides: Lying on your back, slowly slide your foot toward your buttocks, bending your knee up off the floor (only go as far as is comfortable). Then slowly slide your foot back down until your leg is flat on the floor again.  . Angel Wings: Lying on your back spread your legs to the side as far apart as you can without causing discomfort.  . Hamstring Strength:  Lying on your back, push your heel against the floor with your leg straight by tightening up the muscles of your buttocks.  Repeat, but this time bend your knee to a comfortable angle, and push your heel against the floor.  You may put a pillow under the heel to make it more comfortable if necessary.   A rehabilitation program following joint replacement surgery can speed recovery and prevent re-injury in the future due to weakened muscles. Contact your doctor or a physical therapist for more information on knee rehabilitation.    CONSTIPATION  Constipation is defined medically as fewer than three stools per week and severe constipation as less than one stool per week.  Even if you have a regular bowel pattern at home, your normal regimen is likely to be disrupted due to multiple reasons following surgery.  Combination of anesthesia, postoperative narcotics, change in appetite and fluid intake all can affect your bowels.   YOU MUST use at least one of the following options; they are listed in order of increasing strength  to get the job done.  They are all available over the counter, and you may need to use some, POSSIBLY even all of these options:    Drink plenty of fluids (prune juice may be helpful) and high fiber foods Colace 100 mg by mouth twice a day  Senokot for constipation as directed and as needed Dulcolax (bisacodyl), take with full glass of water  Miralax (polyethylene glycol) once or twice a day as needed.  If you have tried all these things and are unable to have a bowel movement in the first 3-4 days after surgery call either your surgeon or your primary doctor.    If you experience loose stools or diarrhea, hold the medications until you stool forms back up.  If your symptoms do not get better within 1 week or if they get worse,   check with your doctor.  If you experience "the worst abdominal pain ever" or develop nausea or vomiting, please contact the office immediately for further recommendations for treatment.   ITCHING:  If you experience itching with your medications, try taking only a single pain pill, or even half a pain pill at a time.  You can also use Benadryl over the counter for itching or also to help with sleep.   TED HOSE STOCKINGS:  Use stockings on both legs until for at least 2 weeks or as directed by physician office. They may be removed at night for sleeping.  MEDICATIONS:  See your medication summary on the "After Visit Summary" that nursing will review with you.  You may have some home medications which will be placed on hold until you complete the course of blood thinner medication.  It is important for you to complete the blood thinner medication as prescribed.  PRECAUTIONS:  If you experience chest pain or shortness of breath - call 911 immediately for transfer to the hospital emergency department.   If you develop a fever greater that 101 F, purulent drainage from wound, increased redness or drainage from wound, foul odor from the wound/dressing, or calf pain - CONTACT  YOUR SURGEON.                                                   FOLLOW-UP APPOINTMENTS:  If you do not already have a post-op appointment, please call the office for an appointment to be seen by your surgeon.  Guidelines for how soon to be seen are listed in your "After Visit Summary", but are typically between 1-4 weeks after surgery.  OTHER INSTRUCTIONS:  Dental Antibiotics:  In most cases prophylactic antibiotics for Dental procdeures after total joint surgery are not necessary.  Exceptions are as follows:  1. History of prior total joint infection  2. Severely immunocompromised (Organ Transplant, cancer chemotherapy, Rheumatoid biologic meds such as Humera)  3. Poorly controlled diabetes (A1C &gt; 8.0, blood glucose over 200)  If you have one of these conditions, contact your surgeon for an antibiotic prescription, prior to your dental procedure.   MAKE SURE YOU:  . Understand these instructions.  . Get help right away if you are not doing well or get worse.    Thank you for letting us be a part of your medical care team.  It is a privilege we respect greatly.  We hope these instructions will help you stay on track for a fast and full recovery!     

## 2020-09-15 NOTE — Interval H&P Note (Signed)
History and Physical Interval Note:  09/15/2020 7:19 AM  Robert Bernard  has presented today for surgery, with the diagnosis of OA RIGHT KNEE.  The various methods of treatment have been discussed with the patient and family. After consideration of risks, benefits and other options for treatment, the patient has consented to  Procedure(s): TOTAL KNEE ARTHROPLASTY (Right) as a surgical intervention.  The patient's history has been reviewed, patient examined, no change in status, stable for surgery.  I have reviewed the patient's chart and labs.  Questions were answered to the patient's satisfaction.     Sheral Apley

## 2020-09-15 NOTE — Anesthesia Postprocedure Evaluation (Signed)
Anesthesia Post Note  Patient: Robert Bernard  Procedure(s) Performed: TOTAL KNEE ARTHROPLASTY (Right Knee)     Patient location during evaluation: PACU Anesthesia Type: Spinal Level of consciousness: awake Pain management: pain level controlled Vital Signs Assessment: post-procedure vital signs reviewed and stable Respiratory status: spontaneous breathing Cardiovascular status: stable Postop Assessment: no headache, no backache, spinal receding, patient able to bend at knees and no apparent nausea or vomiting Anesthetic complications: no   No complications documented.  Last Vitals:  Vitals:   09/15/20 1000 09/15/20 1015  BP: 117/76 121/71  Pulse: 75 66  Resp: 19 16  Temp:    SpO2: 97% 99%    Last Pain:  Vitals:   09/15/20 1015  TempSrc:   PainSc: 0-No pain   Pain Goal: Patients Stated Pain Goal: 4 (09/15/20 9407)                 [name]

## 2020-09-15 NOTE — Transfer of Care (Signed)
Immediate Anesthesia Transfer of Care Note  Patient: Robert Bernard  Procedure(s) Performed: TOTAL KNEE ARTHROPLASTY (Right Knee)  Patient Location: PACU  Anesthesia Type:Spinal  Level of Consciousness: awake, drowsy and patient cooperative  Airway & Oxygen Therapy: Patient Spontanous Breathing and Patient connected to face mask oxygen  Post-op Assessment: Report given to RN and Post -op Vital signs reviewed and stable  Post vital signs: Reviewed and stable  Last Vitals:  Vitals Value Taken Time  BP 114/70 09/15/20 0938  Temp    Pulse 79 09/15/20 0939  Resp 16 09/15/20 0939  SpO2 100 % 09/15/20 0939  Vitals shown include unvalidated device data.  Last Pain:  Vitals:   09/15/20 0619  TempSrc: Oral  PainSc:       Patients Stated Pain Goal: 4 (09/15/20 4920)  Complications: No complications documented.

## 2020-09-15 NOTE — Op Note (Signed)
DATE OF SURGERY:  09/15/2020 TIME: 8:42 AM  PATIENT NAME:  Robert Bernard   AGE: 59 y.o.    PRE-OPERATIVE DIAGNOSIS:  OA RIGHT KNEE  POST-OPERATIVE DIAGNOSIS:  Same  PROCEDURE:  Procedure(s): TOTAL KNEE ARTHROPLASTY   SURGEON:  Sheral Apley, MD   ASSISTANT:  Daun Peacock, PA-C, he was present and scrubbed throughout the case, critical for completion in a timely fashion, and for retraction, instrumentation, and closure.    OPERATIVE IMPLANTS: [employer] Triathlon Posterior Stabilized. Press fit knee  Femur size 5, Tibia size 5, Patella size 29 3-peg oval button, with a 9 mm polyethylene insert.   PREOPERATIVE INDICATIONS:  Robert Bernard is a 59 y.o. year old male with end stage bone on bone degenerative arthritis of the knee who failed conservative treatment, including injections, antiinflammatories, activity modification, and assistive devices, and had significant impairment of their activities of daily living, and elected for Total Knee Arthroplasty.   The risks, benefits, and alternatives were discussed at length including but not limited to the risks of infection, bleeding, nerve injury, stiffness, blood clots, the need for revision surgery, cardiopulmonary complications, among others, and they were willing to proceed.   OPERATIVE DESCRIPTION:  The patient was brought to the operative room and placed in a supine position.  General anesthesia was administered.  IV antibiotics were given.  The lower extremity was prepped and draped in the usual sterile fashion.  Time out was performed.  The leg was elevated and exsanguinated and the tourniquet was inflated.  Anterior approach was performed.  The patella was everted and osteophytes were removed.  The anterior horn of the medial and lateral meniscus was removed.   The distal femur was opened with the drill and the intramedullary distal femoral cutting jig was utilized, set at 5 degrees resecting 8 mm off the distal femur.  Care was  taken to protect the collateral ligaments.  The distal femoral sizing jig was applied, taking care to avoid notching.  Then the 4-in-1 cutting jig was applied and the anterior and posterior femur was cut, along with the chamfer cuts.  All posterior osteophytes were removed.  The flexion gap was then measured and was symmetric with the extension gap.  Then the extramedullary tibial cutting jig was utilized making the appropriate cut using the anterior tibial crest as a reference building in appropriate posterior slope.  Care was taken during the cut to protect the medial and collateral ligaments.  The proximal tibia was removed along with the posterior horns of the menisci.  The PCL was sacrificed.    The extensor gap was measured and was approximately 49mm.    I completed the distal femoral preparation using the appropriate jig to prepare the box.  The patella was then measured, and cut with the saw.    The proximal tibia sized and prepared accordingly with the reamer and the punch, and then all components were trialed with the above sized poly insert.  The knee was found to have excellent balance and full motion.    The above named components were then impacted into place and Poly tibial piece and patella were inserted.  I was very happy with his stability and ROM  I performed a periarticular injection with marcaine and toradol  The knee was easily taken through a range of motion and the patella tracked well and the knee irrigated copiously and the parapatellar and subcutaneous tissue closed with vicryl, and monocryl with steri strips for the skin.  The incision  was dressed with sterile gauze and the tourniquet released and the patient was awakened and returned to the PACU in stable and satisfactory condition.  There were no complications.  Total tourniquet time was roughly 60 minutes.   POSTOPERATIVE PLAN: post op Abx, DVT px: SCD's, TED's, Early ambulation and chemical px

## 2020-09-15 NOTE — Evaluation (Signed)
Physical Therapy Evaluation Patient Details Name: Robert Bernard MRN: [id number] DOB: [birth year] Today's Date: 09/15/2020   History of Present Illness  Patient is 59 y.o. male s/p Rt TKA on 09/15/20 with PMH significant for OA, HTN, asthma, Lt UKA.  Clinical Impression  Robert Bernard is a 59 y.o. male POD 0 s/p Rt TKA. Patient reports independence with mobility at baseline. Patient is now limited by functional impairments (see PT problem list below) and requires min guard/supervision for transfers and gait with RW. Patient was able to ambulate ~120 feet with RW and min guard/supervision and cues for safe walker management. Patient educated on safe sequencing for stair mobility and verbalized safe guarding position for people assisting with mobility. Patient instructed in exercises to facilitate ROM and circulation. Patient will benefit from continued skilled PT interventions to address impairments and progress towards PLOF. Patient has met mobility goals at adequate level for discharge home; will continue to follow if pt continues acute stay to progress towards Mod I goals.     Follow Up Recommendations Follow surgeons recommendation for DC plan and follow-up therapies    Equipment Recommendations  None recommended by PT    Recommendations for Other Services       Precautions / Restrictions Precautions Precautions: Fall Restrictions Weight Bearing Restrictions: No Other Position/Activity Restrictions: WBAT      Mobility  Bed Mobility Overal bed mobility: Needs Assistance Bed Mobility: Supine to Sit;Sit to Supine     Supine to sit: Min guard;Supervision Sit to supine: Min guard;Supervision   General bed mobility comments: no assist needed, guard/supervision for safety. pt taking extra time to bring LE's on/off EOB.    Transfers Overall transfer level: Needs assistance Equipment used: Rolling walker (2 wheeled) Transfers: Sit to/from Stand Sit to Stand: Min guard;Supervision          General transfer comment: VC's for safe hand placement on RW, no assist needed for power up. pt steady standing.  Ambulation/Gait Ambulation/Gait assistance: Min guard;Supervision Gait Distance (Feet): 120 Feet Assistive device: Rolling walker (2 wheeled) Gait Pattern/deviations: Step-to pattern;Decreased stride length;Decreased weight shift to right Gait velocity: decr   General Gait Details: VC's for step pattern/proximity to RW, no overt LOB noted and no buckling at Rt knee.  Stairs Stairs: Yes Stairs assistance: Min guard Stair Management: One rail Left;Step to pattern;Forwards;With cane Number of Stairs: 3 General stair comments: VC's for safe step pattern "up with good, down with bad" and safe guarding from spouse. no overt LOB. pt verbalized understanding of guarding position.  Wheelchair Mobility    Modified Rankin (Stroke Patients Only)       Balance Overall balance assessment: Needs assistance Sitting-balance support: Feet supported Sitting balance-Leahy Scale: Good     Standing balance support: During functional activity;Bilateral upper extremity supported Standing balance-Leahy Scale: Fair                               Pertinent Vitals/Pain Pain Assessment: 0-10 Pain Score: 5  Pain Location: Rt Knee Pain Descriptors / Indicators: Aching;Discomfort Pain Intervention(s): Limited activity within patient's tolerance;Monitored during session;Repositioned;Ice applied    Home Living Family/patient expects to be discharged to:: Private residence Living Arrangements: Spouse/significant other Available Help at Discharge: Family Type of Home: House Home Access: Stairs to enter Entrance Stairs-Rails: Left (window on Rt) Entrance Stairs-Number of Steps: 4 Home Layout: Full bath on main level;Able to live on main level with bedroom/bathroom;One level;Laundry or work area in  basement Home Equipment: Walker - 2 wheels;Cane - single point;Shower  seat      Prior Function Level of Independence: Independent               Hand Dominance   Dominant Hand: Right    Extremity/Trunk Assessment   Upper Extremity Assessment Upper Extremity Assessment: Overall WFL for tasks assessed    Lower Extremity Assessment Lower Extremity Assessment: RLE deficits/detail RLE Deficits / Details: good quad activation, no extensor lag with SLR RLE Sensation: WNL RLE Coordination: WNL    Cervical / Trunk Assessment Cervical / Trunk Assessment: Normal  Communication   Communication: No difficulties  Cognition Arousal/Alertness: Awake/alert Behavior During Therapy: WFL for tasks assessed/performed Overall Cognitive Status: Within Functional Limits for tasks assessed                                        General Comments      Exercises Total Joint Exercises Ankle Circles/Pumps: AROM;Both;20 reps;Supine Quad Sets: AROM;Right;Other reps (comment);Supine (3) Short Arc Quad: AROM;Right;Other reps (comment);Supine (2) Heel Slides: AROM;Right;Other reps (comment);Supine (2) Hip ABduction/ADduction: AROM;Right;Other reps (comment);Supine (2) Straight Leg Raises: AROM;Right;Other reps (comment);Supine (2) Long Arc Quad: AROM;Right;Other reps (comment);Supine (2) Knee Flexion: AROM;Right;Other reps (comment);Supine (2)   Assessment/Plan    PT Assessment Patient needs continued PT services  PT Problem List Decreased strength;Decreased range of motion;Decreased activity tolerance;Decreased balance;Decreased mobility;Decreased knowledge of use of DME;Decreased knowledge of precautions;Pain       PT Treatment Interventions DME instruction;Gait training;Functional mobility training;Stair training;Therapeutic activities;Therapeutic exercise;Balance training;Patient/family education    PT Goals (Current goals can be found in the Care Plan section)  Acute Rehab PT Goals Patient Stated Goal: get back to independence PT Goal  Formulation: With patient Time For Goal Achievement: 09/22/20 Potential to Achieve Goals: Good    Frequency 7X/week   Barriers to discharge        Co-evaluation               AM-PAC PT "6 Clicks" Mobility  Outcome Measure Help needed turning from your back to your side while in a flat bed without using bedrails?: None Help needed moving from lying on your back to sitting on the side of a flat bed without using bedrails?: A Little Help needed moving to and from a bed to a chair (including a wheelchair)?: A Little Help needed standing up from a chair using your arms (e.g., wheelchair or bedside chair)?: A Little Help needed to walk in hospital room?: A Little Help needed climbing 3-5 steps with a railing? : A Little 6 Click Score: 19    End of Session Equipment Utilized During Treatment: Gait belt Activity Tolerance: Patient tolerated treatment well Patient left: in bed;with call bell/phone within reach Nurse Communication: Mobility status PT Visit Diagnosis: Muscle weakness (generalized) (M62.81);Difficulty in walking, not elsewhere classified (R26.2);Pain Pain - Right/Left: Right Pain - part of body: Knee    Time: 2993-7169 PT Time Calculation (min) (ACUTE ONLY): 31 min   Charges:   PT Evaluation $PT Eval Low Complexity: 1 Low PT Treatments $Gait Training: 8-22 mins        Verner Mould, DPT Acute Rehabilitation Services Office [phone] Pager [phone]    Jacques Navy 09/15/2020, 2:24 PM

## 2020-09-15 NOTE — Progress Notes (Signed)
Orthopedic Tech Progress Note Patient Details:  Robert Bernard 1961/08/05 [id number]  Ortho Devices Type of Ortho Device: Knee Immobilizer Ortho Device/Splint Location: right       Saul Fordyce 09/15/2020, 12:26 PM

## 2020-09-15 NOTE — Anesthesia Procedure Notes (Signed)
Anesthesia Regional Block: Adductor canal block   Pre-Anesthetic Checklist: ,, timeout performed, Correct Patient, Correct Site, Correct Laterality, Correct Procedure, Correct Position, site marked, Risks and benefits discussed,  Surgical consent,  Pre-op evaluation,  At surgeon's request and post-op pain management  Laterality: Lower and Right  Prep: chloraprep       Needles:  Injection technique: Single-shot  Needle Type: Echogenic Stimulator Needle     Needle Length: 9cm  Needle Gauge: 20   Needle insertion depth: 3 cm   Additional Needles:   Procedures:,,,, ultrasound used (permanent image in chart),,,,  Narrative:  Start time: 09/15/2020 7:02 AM End time: 09/15/2020 7:11 AM Injection made incrementally with aspirations every 5 mL.  Performed by: Personally  Anesthesiologist: Leilani Able, MD

## 2020-09-16 ENCOUNTER — Encounter (HOSPITAL_COMMUNITY): Payer: Self-pay | Admitting: Orthopedic Surgery

## 2021-07-24 IMAGING — DX DG KNEE 1-2V PORT*R*
2 series · 2 of 2 positions shown · non-contrast
Comparison: MR knee 07/20/2020

CLINICAL DATA: Right knee arthroplasty.

EXAM:
PORTABLE RIGHT KNEE - 1-2 VIEW

[knee ap]
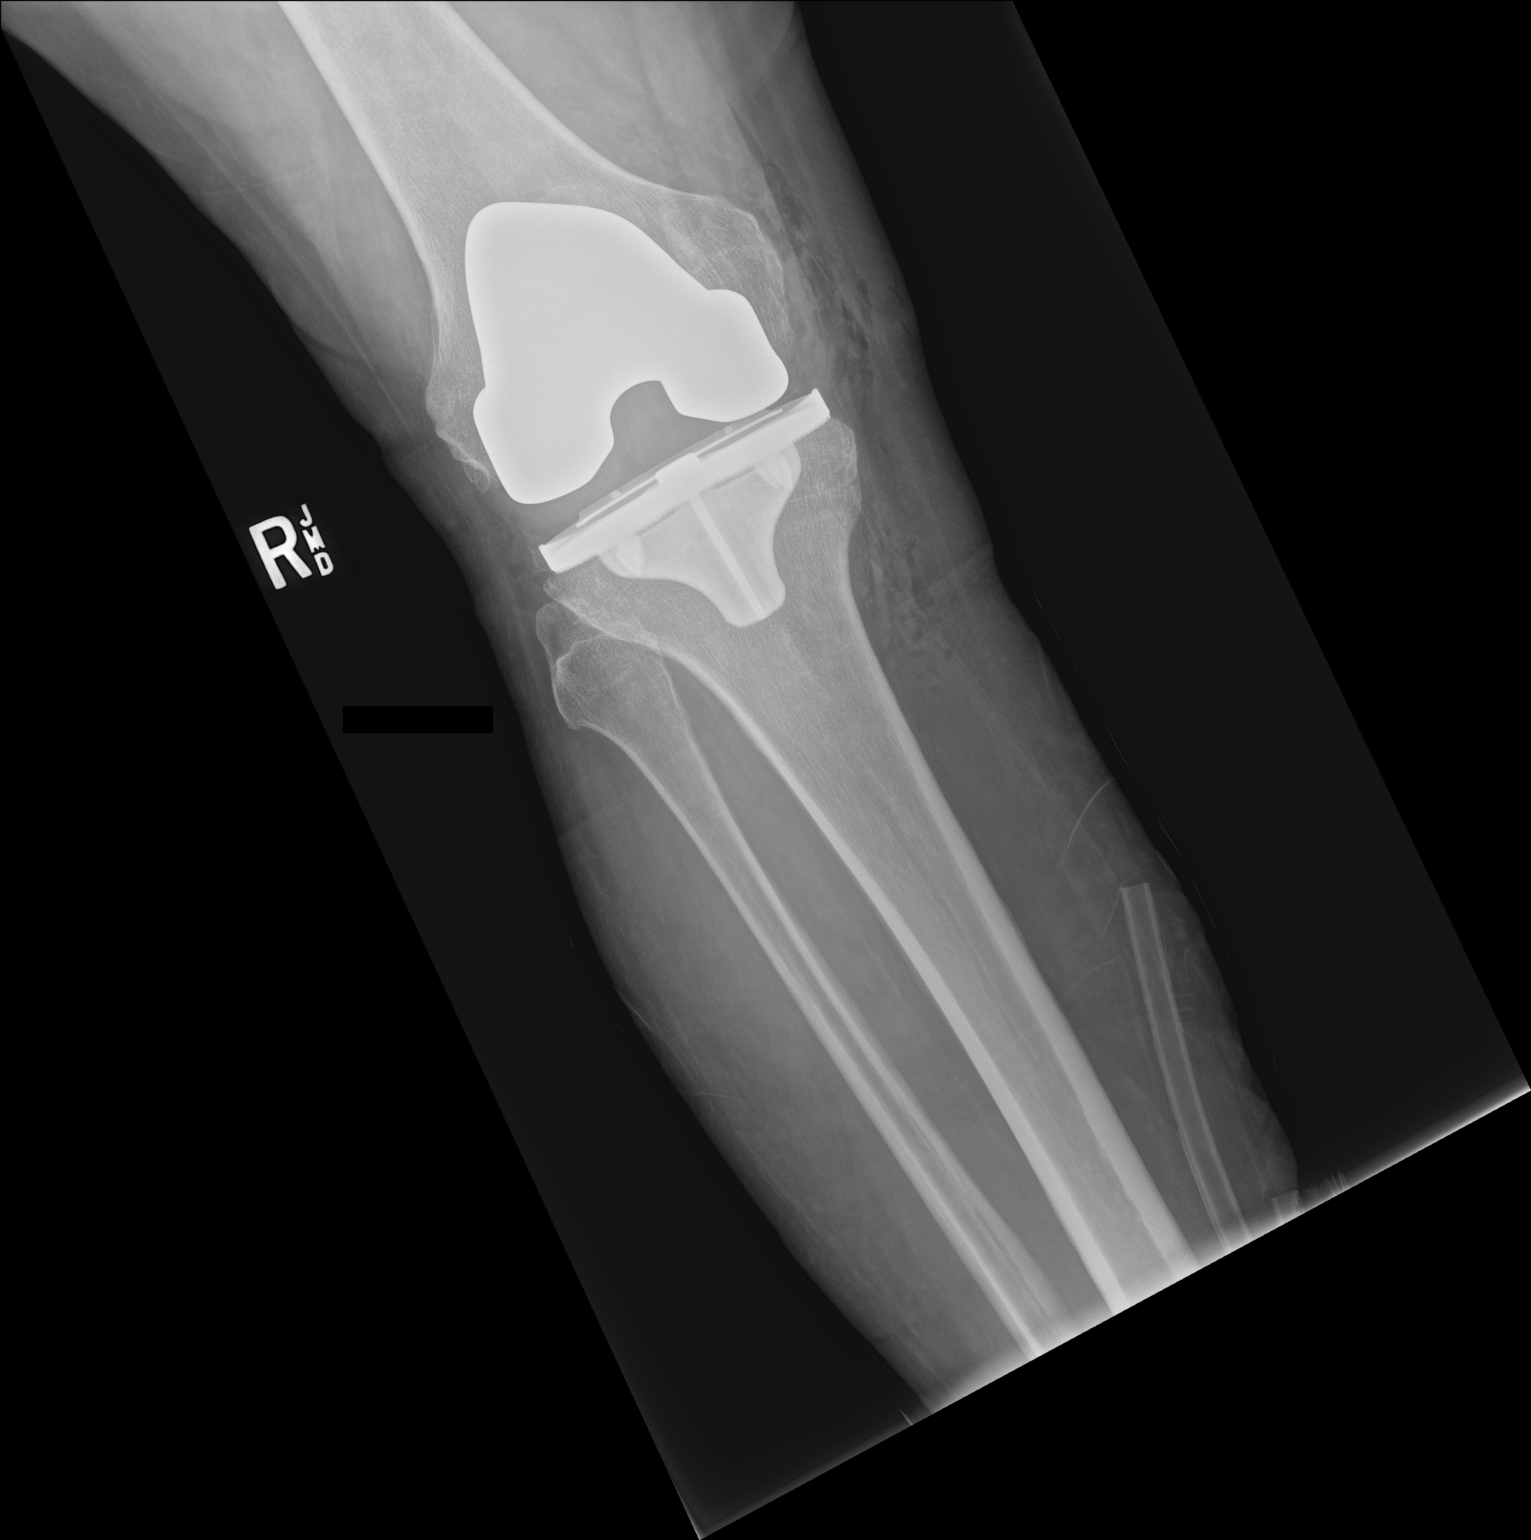

[knee lat]
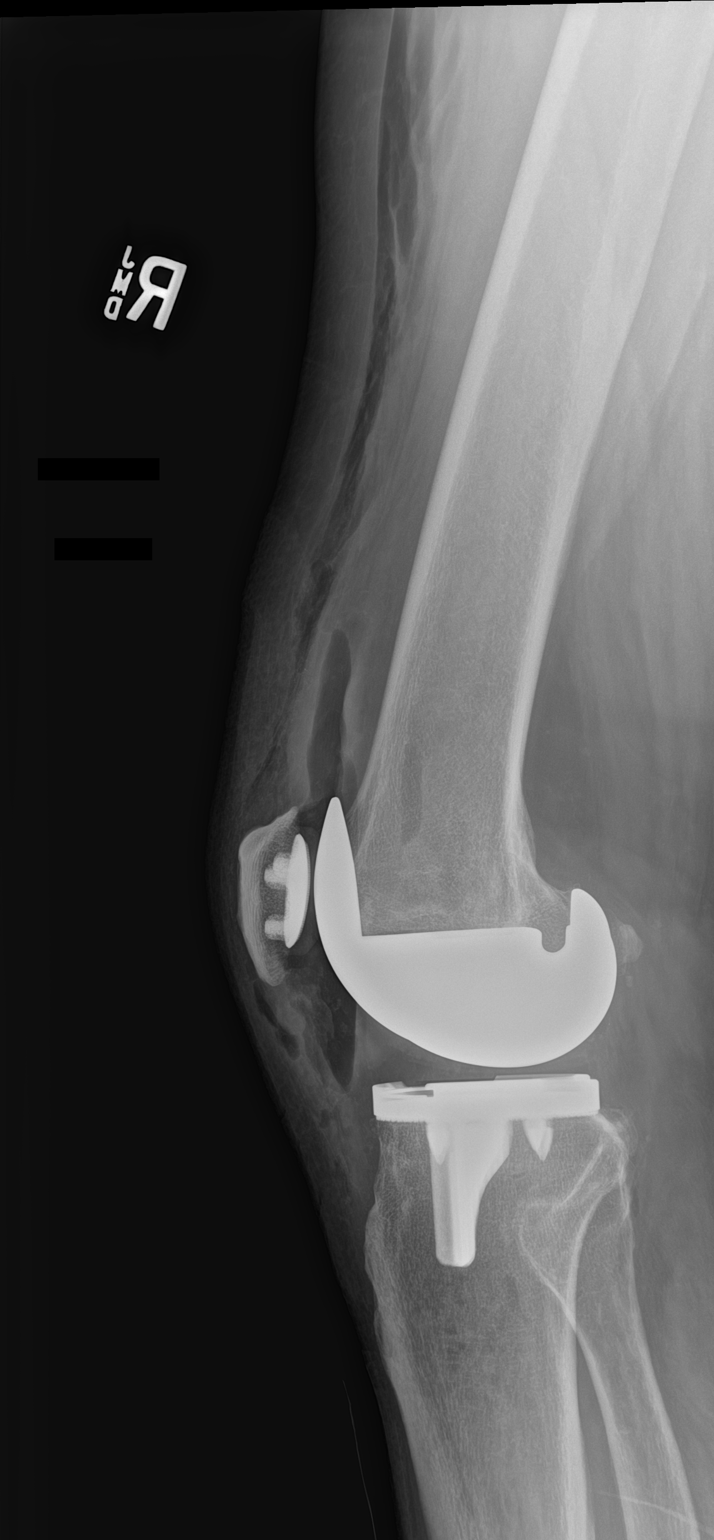

[2 of 2 positions shown; findings below may reference images not displayed]

FINDINGS: Postoperative changes from right total knee arthroplasty identified.
The hardware components are in anatomic alignment. No signs of
periprosthetic fracture or subluxation. Gas is identified within the
joint space and along the ventral soft tissues of the lower leg.
IMPRESSION: Status post right total knee arthroplasty.
